# Patient Record
Sex: Female | Born: 1975 | Race: Black or African American | Hispanic: No | Marital: Single | Smoking: Former smoker
Health system: Southern US, Community
[De-identification: ages and names within clinical notes are randomized; demographics above are authoritative.]

## PROBLEM LIST (undated history)

## (undated) HISTORY — PX: OTHER SURGICAL HISTORY: SHX169

## (undated) HISTORY — PX: PARTIAL HYSTERECTOMY: SHX80

## (undated) HISTORY — PX: OVARIAN CYST REMOVAL: SHX89

## (undated) HISTORY — PX: ABDOMINAL HYSTERECTOMY: SHX81

---

## 2011-12-09 ENCOUNTER — Emergency Department: Payer: Self-pay | Admitting: Emergency Medicine

## 2011-12-09 LAB — COMPREHENSIVE METABOLIC PANEL
Anion Gap: 8 (ref 7–16)
Calcium, Total: 9.2 mg/dL (ref 8.5–10.1)
Chloride: 109 mmol/L — ABNORMAL HIGH (ref 98–107)
Co2: 24 mmol/L (ref 21–32)
EGFR (Non-African Amer.): >60
Osmolality: 281 (ref 275–301)
Potassium: 3.4 mmol/L — ABNORMAL LOW (ref 3.5–5.1)
SGOT(AST): 11 U/L — ABNORMAL LOW (ref 15–37)

## 2011-12-09 LAB — URINALYSIS, COMPLETE
Glucose,UR: NEGATIVE mg/dL (ref 0–75)
Nitrite: NEGATIVE
Protein: NEGATIVE
RBC,UR: 1 /HPF (ref 0–5)

## 2011-12-09 LAB — LIPASE, BLOOD: Lipase: 254 U/L (ref 73–393)

## 2011-12-09 LAB — CBC
MCH: 30.5 pg (ref 26.0–34.0)
MCHC: 34.3 g/dL (ref 32.0–36.0)
MCV: 89 fL (ref 80–100)
Platelet: 227 10*3/uL (ref 150–440)
RBC: 4.36 10*6/uL (ref 3.80–5.20)

## 2011-12-14 ENCOUNTER — Emergency Department: Payer: Self-pay | Admitting: Emergency Medicine

## 2011-12-14 LAB — COMPREHENSIVE METABOLIC PANEL
Albumin: 3.6 g/dL (ref 3.4–5.0)
Alkaline Phosphatase: 71 U/L (ref 50–136)
BUN: 8 mg/dL (ref 7–18)
Calcium, Total: 8.4 mg/dL — ABNORMAL LOW (ref 8.5–10.1)
Co2: 23 mmol/L (ref 21–32)
Creatinine: 0.91 mg/dL (ref 0.60–1.30)
EGFR (Non-African Amer.): >60
Glucose: 89 mg/dL (ref 65–99)
Potassium: 3.6 mmol/L (ref 3.5–5.1)
SGPT (ALT): 16 U/L (ref 12–78)
Sodium: 141 mmol/L (ref 136–145)

## 2011-12-14 LAB — URINALYSIS, COMPLETE
Bilirubin,UR: NEGATIVE
Blood: NEGATIVE
Ketone: NEGATIVE
Ph: 7 (ref 4.5–8.0)
Protein: NEGATIVE
Specific Gravity: 1.016 (ref 1.003–1.030)
Squamous Epithelial: 11

## 2011-12-14 LAB — PREGNANCY, URINE: Pregnancy Test, Urine: NEGATIVE m[IU]/mL

## 2011-12-14 LAB — CBC
Platelet: 173 10*3/uL (ref 150–440)
RBC: 4.24 10*6/uL (ref 3.80–5.20)
WBC: 5.8 10*3/uL (ref 3.6–11.0)

## 2011-12-14 LAB — LIPASE, BLOOD: Lipase: 247 U/L (ref 73–393)

## 2011-12-15 LAB — DRUG SCREEN, URINE
Amphetamines, Ur Screen: NEGATIVE (ref ?–1000)
Barbiturates, Ur Screen: NEGATIVE (ref ?–200)
Cannabinoid 50 Ng, Ur ~~LOC~~: POSITIVE (ref ?–50)
Opiate, Ur Screen: POSITIVE (ref ?–300)
Phencyclidine (PCP) Ur S: NEGATIVE (ref ?–25)

## 2011-12-24 ENCOUNTER — Encounter (HOSPITAL_COMMUNITY): Payer: Self-pay

## 2011-12-24 ENCOUNTER — Emergency Department (HOSPITAL_COMMUNITY)
Admission: EM | Admit: 2011-12-24 | Discharge: 2011-12-24 | Disposition: A | Discharge status: Home / Self Care | Payer: Self-pay | Attending: Emergency Medicine | Admitting: Emergency Medicine

## 2011-12-24 DIAGNOSIS — N898 Other specified noninflammatory disorders of vagina: Secondary | ICD-10-CM | POA: Insufficient documentation

## 2011-12-24 DIAGNOSIS — R109 Unspecified abdominal pain: Secondary | ICD-10-CM | POA: Insufficient documentation

## 2011-12-24 DIAGNOSIS — R102 Pelvic and perineal pain: Secondary | ICD-10-CM

## 2011-12-24 DIAGNOSIS — F17200 Nicotine dependence, unspecified, uncomplicated: Secondary | ICD-10-CM | POA: Insufficient documentation

## 2011-12-24 LAB — COMPREHENSIVE METABOLIC PANEL
Alkaline Phosphatase: 62 U/L (ref 39–117)
BUN: 9 mg/dL (ref 6–23)
CO2: 23 mEq/L (ref 19–32)
Chloride: 101 mEq/L (ref 96–112)
GFR calc Af Amer: >90 mL/min (ref >90)
GFR calc non Af Amer: >90 mL/min (ref >90)
Glucose, Bld: 82 mg/dL (ref 70–99)
Potassium: 3.3 mEq/L — ABNORMAL LOW (ref 3.5–5.1)
Total Bilirubin: 0.3 mg/dL (ref 0.3–1.2)

## 2011-12-24 LAB — URINALYSIS, ROUTINE W REFLEX MICROSCOPIC
Bilirubin Urine: NEGATIVE
Glucose, UA: NEGATIVE mg/dL
Leukocytes, UA: NEGATIVE
Nitrite: NEGATIVE
Specific Gravity, Urine: 1.021 (ref 1.005–1.030)
pH: 6 (ref 5.0–8.0)

## 2011-12-24 LAB — PREGNANCY, URINE: Preg Test, Ur: NEGATIVE

## 2011-12-24 LAB — CBC WITH DIFFERENTIAL/PLATELET
HCT: 35.5 % — ABNORMAL LOW (ref 36.0–46.0)
Hemoglobin: 12.3 g/dL (ref 12.0–15.0)
Lymphs Abs: 2 10*3/uL (ref 0.7–4.0)
Monocytes Absolute: 0.4 10*3/uL (ref 0.1–1.0)
Monocytes Relative: 5 % (ref 3–12)
Neutro Abs: 5.1 10*3/uL (ref 1.7–7.7)
Neutrophils Relative %: 68 % (ref 43–77)
RBC: 4.13 MIL/uL (ref 3.87–5.11)

## 2011-12-24 LAB — WET PREP, GENITAL: Trich, Wet Prep: NONE SEEN

## 2011-12-24 MED ORDER — ONDANSETRON HCL 4 MG/2ML IJ SOLN
4.0000 mg | Freq: Once | INTRAMUSCULAR | Status: AC
  Administered 2011-12-24: 4 mg via INTRAVENOUS
  Filled 2011-12-24: qty 2

## 2011-12-24 MED ORDER — HYDROCODONE-ACETAMINOPHEN 5-500 MG PO TABS: 1.0000 | ORAL_TABLET | Freq: Four times a day (QID) | ORAL | Status: DC | PRN

## 2011-12-24 MED ORDER — MORPHINE SULFATE 4 MG/ML IJ SOLN
4.0000 mg | Freq: Once | INTRAMUSCULAR | Status: AC
  Administered 2011-12-24: 4 mg via INTRAVENOUS
  Filled 2011-12-24: qty 1

## 2011-12-24 NOTE — ED Notes (Signed)
Per Pt, having vaginal bleed and pain.  No periods since 2009 d/t partial hysterectomy.  Pain started this am.  No difficulty urinating.  Pt states nausea, no vomiting or diarrhea.  Decreased appetite d/t pain.

## 2011-12-24 NOTE — ED Notes (Signed)
Pt presents to the Ed with a history of a partial hysterectomy.  The right ovary was left.  Pt states hospital in [place] states that she had an ovarian cyst but that it would pass.  Patient is doubled over with pain.

## 2011-12-24 NOTE — ED Provider Notes (Signed)
History     CSN: [id number]  Arrival date & time 12/24/11  4098   First MD Initiated Contact with Patient 12/24/11 0450      Chief Complaint  Patient presents with  . Vaginal Bleeding  . Abdominal Pain    (Consider location/radiation/quality/duration/timing/severity/associated sxs/prior treatment) HPI Comments: Patient comes in today with a chief complaint of right sided pelvic pain and vaginal bleeding.  She reports that her symptoms began yesterday morning and have been constant since that time.  Pain gradual in onset and has not changed from the time of onset.  She describes the pain as an intense pressure.  She reports that the pain feels similar to when she had a ruptured ovarian cyst in the past.  Past surgical history significant for Partial Hysterectomy in 2009.  She states that she still has her right ovary.  She reports that she is feeling nauseous, but no vomiting.  No diarrhea.  Last BM was yesterday and was normal.  She denies fever or chills.    The history is provided by the patient.    History reviewed. No pertinent past medical history.  Past Surgical History  Procedure Date  . Partial hysterectomy   . Firbroidectomy   . Ovarian cyst removal   . Abdominal hysterectomy     History reviewed. No pertinent family history.  History  Substance Use Topics  . Smoking status: Current Some Day Smoker -- 0.5 packs/day  . Smokeless tobacco: Not on file  . Alcohol Use: Yes     social    OB History    Grav Para Term Preterm Abortions TAB SAB Ect Mult Living                  Review of Systems  Constitutional: Negative for fever, chills and appetite change.  Gastrointestinal: Positive for nausea. Negative for vomiting, diarrhea, constipation and blood in stool.  Genitourinary: Positive for vaginal bleeding and pelvic pain. Negative for dysuria, frequency, hematuria and vaginal discharge.    Allergies  Review of patient's allergies indicates not on file.  Home  Medications   Current Outpatient Rx  Name Route Sig Dispense Refill  . IBUPROFEN 200 MG PO TABS Oral Take 400 mg by mouth every 8 (eight) hours as needed. For pain    . METRONIDAZOLE 500 MG PO TABS Oral Take 500 mg by mouth 3 (three) times daily. For 7 days. Started 12/05/11 and finished.      BP 135/75  Pulse 69  Temp 98 F (36.7 C) (Oral)  Resp 21  Ht 5\' 6"  (1.676 m)  Wt 155 lb (70.308 kg)  BMI 25.02 kg/m2  SpO2 100%  Physical Exam  Nursing note and vitals reviewed. Constitutional: She appears well-developed and well-nourished. No distress.  HENT:  Head: Normocephalic and atraumatic.  Mouth/Throat: Oropharynx is clear and moist.  Neck: Normal range of motion. Neck supple.  Cardiovascular: Normal rate, regular rhythm and normal heart sounds.   Pulmonary/Chest: Effort normal and breath sounds normal.  Abdominal: Soft. Bowel sounds are normal. She exhibits no distension and no mass. There is tenderness in the right lower quadrant. There is no rigidity, no rebound, no guarding and no tenderness at McBurney's point.       Negative Rovsing's.    Genitourinary: Right adnexum displays mass and tenderness. Right adnexum displays no fullness. Left adnexum displays no mass, no tenderness and no fullness.       No vaginal bleeding  Neurological: She is alert.  Skin:  Skin is warm and dry. She is not diaphoretic.  Psychiatric: She has a normal mood and affect.    ED Course  Procedures (including critical care time)  Labs Reviewed  URINALYSIS, ROUTINE W REFLEX MICROSCOPIC - Abnormal; Notable for the following:    APPearance CLOUDY (*)     Ketones, ur 15 (*)     All other components within normal limits  CBC WITH DIFFERENTIAL  COMPREHENSIVE METABOLIC PANEL   No results found.   No diagnosis found.    MDM  Patient presents today with right sided pelvic pain.  She reports that it feels similar to pain she had in the past with Ovarian cyst.  She also states that she has had some  vaginal bleeding.  However, patient had a Partial Hysterectomy.  No blood visualized on pelvic exam.  Patient did have right sided adnexal tenderness.  However, patient is afebrile.  WBC within normal limits.  Therefore, doubt tuboovarian abscess.  Patient has had the pain for 24 hours.  Pain gradual in onset.  Patient not writhing in pain.  Therefore, doubt Ovarian Torsion.  Therefore, do not feel that Pelvic Ultrasound needs to be done on an emergent basis.  Patient discharged home and instructed to follow up with her OB/GYN.         Pascal Lux [place], PA-C 12/24/11 [phone]

## 2011-12-25 NOTE — ED Provider Notes (Signed)
Medical screening examination/treatment/procedure(s) were performed by non-physician practitioner and as supervising physician I was immediately available for consultation/collaboration.   Mcguire Gasparyan R Thorn Demas, MD 12/25/11 0445 

## 2012-01-21 ENCOUNTER — Emergency Department (HOSPITAL_COMMUNITY)
Admission: EM | Admit: 2012-01-21 | Discharge: 2012-01-21 | Disposition: A | Discharge status: Home / Self Care | Payer: Self-pay | Attending: Emergency Medicine | Admitting: Emergency Medicine

## 2012-01-21 ENCOUNTER — Encounter (HOSPITAL_COMMUNITY): Payer: Self-pay | Admitting: Emergency Medicine

## 2012-01-21 DIAGNOSIS — K0889 Other specified disorders of teeth and supporting structures: Secondary | ICD-10-CM

## 2012-01-21 DIAGNOSIS — Z87891 Personal history of nicotine dependence: Secondary | ICD-10-CM | POA: Insufficient documentation

## 2012-01-21 DIAGNOSIS — K089 Disorder of teeth and supporting structures, unspecified: Secondary | ICD-10-CM | POA: Insufficient documentation

## 2012-01-21 MED ORDER — HYDROCODONE-ACETAMINOPHEN 5-325 MG PO TABS: 1.0000 | ORAL_TABLET | Freq: Three times a day (TID) | ORAL | Status: AC | PRN

## 2012-01-21 MED ORDER — HYDROCODONE-ACETAMINOPHEN 5-325 MG PO TABS
1.0000 | ORAL_TABLET | Freq: Once | ORAL | Status: AC
  Administered 2012-01-21: 1 via ORAL
  Filled 2012-01-21: qty 1

## 2012-01-21 NOTE — ED Notes (Addendum)
C/O left upper tooth pain and headache. States has broken tooth. Poor dentition.

## 2012-01-21 NOTE — ED Notes (Signed)
Pt here for toothache started yesterday, pain awake her up this morning. Pt reports left sided headache. Pt rate pain 10/10.

## 2012-01-21 NOTE — ED Provider Notes (Signed)
History  Scribed for Gerhard Munch, MD, the patient was seen in room TR08C/TR08C. This chart was scribed by Candelaria Stagers. The patient's care started at 2:52 PM   CSN: [id number]  Arrival date & time 01/21/12  1412   First MD Initiated Contact with Patient 01/21/12 1442      Chief Complaint  Patient presents with  . Dental Pain   The history is provided by the patient. No language interpreter was used.   Crystal Gentry is a 36 y.o. female who presents to the Emergency Department complaining of left sided dental pain and facial pain that started yesterday.  Pt denies fever, nausea, or vomiting.  Pt has had dental problems to the right side.  She has taken ibuprofen with no relief.  She currently does not have dental insurance.     No past medical history on file.  Past Surgical History  Procedure Date  . Partial hysterectomy   . Firbroidectomy   . Ovarian cyst removal   . Abdominal hysterectomy     No family history on file.  History  Substance Use Topics  . Smoking status: Former Smoker -- 0.5 packs/day  . Smokeless tobacco: Not on file  . Alcohol Use: Yes     Comment: social    OB History    Grav Para Term Preterm Abortions TAB SAB Ect Mult Living                  Review of Systems  Constitutional: Negative for fever and chills.       Per HPI, otherwise negative  HENT: Positive for dental problem (left side).        Per HPI, otherwise negative  Eyes: Negative.   Respiratory:       Per HPI, otherwise negative  Cardiovascular:       Per HPI, otherwise negative  Gastrointestinal: Negative for nausea and vomiting.  Genitourinary: Negative.   Musculoskeletal:       Per HPI, otherwise negative  Skin: Negative.   Neurological: Negative for syncope.  All other systems reviewed and are negative.    Allergies  Vicodin  Home Medications   Current Outpatient Rx  Name  Route  Sig  Dispense  Refill  . IBUPROFEN 200 MG PO TABS   Oral   Take 400 mg by  mouth every 8 (eight) hours as needed. For pain           BP 117/86  Pulse 88  Temp 98.8 F (37.1 C) (Oral)  Resp 16  SpO2 97%  Physical Exam  Nursing note and vitals reviewed. Constitutional: She is oriented to person, place, and time. She appears well-developed and well-nourished. No distress.  HENT:  Head: Normocephalic and atraumatic.       Mandible non tender.  No TMJ tenderness.   Last molar on the left superior side is fractured.    Eyes: EOM are normal.  Neck: Neck supple. No tracheal deviation present.  Cardiovascular: Normal rate.   Pulmonary/Chest: Effort normal. No respiratory distress.  Musculoskeletal: Normal range of motion.  Neurological: She is alert and oriented to person, place, and time.  Skin: Skin is warm and dry.  Psychiatric: She has a normal mood and affect. Her behavior is normal.    ED Course  Procedures   DIAGNOSTIC STUDIES: Oxygen Saturation is 97% on room air, normal by my interpretation.    COORDINATION OF CARE:     Labs Reviewed - No data to display No results found.  No diagnosis found.    MDM  I personally performed the services described in this documentation, which was scribed in my presence. The recorded information has been reviewed and is accurate.  This generally well female presents with ongoing facial pain.  On exam she is in no distress, though she is uncomfortable appearing.  The patient has a notable broken tooth, with no surrounding fluctuance, drainage, and little suspicion of ongoing infection.  However, given the significance of the pain, she was provided analgesics.  We discussed the need for prompt dental followup for definitive care, and return precautions.  Gerhard Munch, MD 01/21/12 [phone]

## 2012-01-28 ENCOUNTER — Emergency Department: Payer: Self-pay | Admitting: Emergency Medicine

## 2013-08-08 IMAGING — CT CT ABD-PELV W/ CM
1 of 2 series · 14 of 32 positions shown, 18 images · non-contrast
Comparison: none

REASON FOR EXAM: (1) rlq abd pain, eval for appy; (2) same
COMMENTS:

[Series 2: 3mm soft tissue · axial · 0.66mm/px · z∈[-394,+8]mm · 14 of 154 slices shown, 18 images]
[im 13/154  soft-tissue]
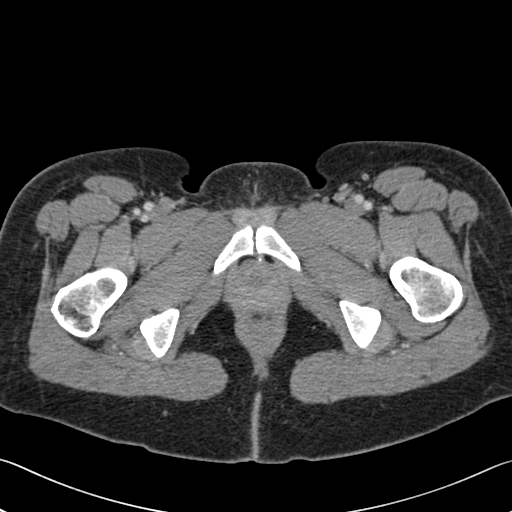
[im 13/154  bone]
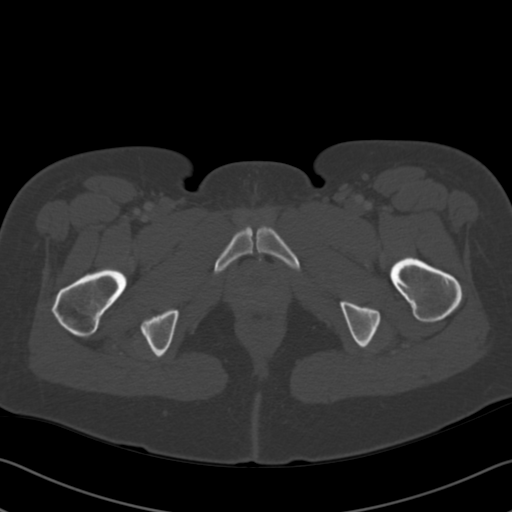
[im 25/154  soft-tissue]
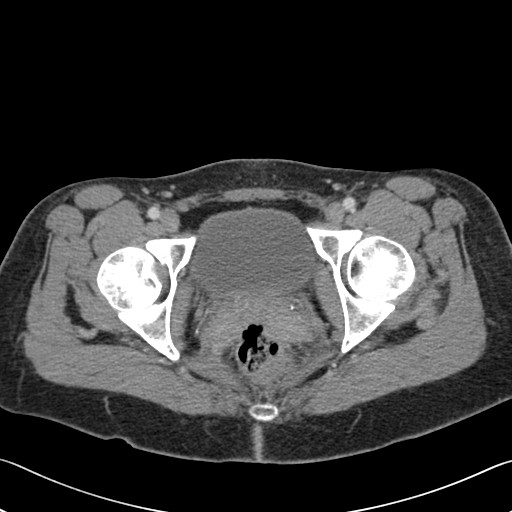
[im 37/154  soft-tissue]
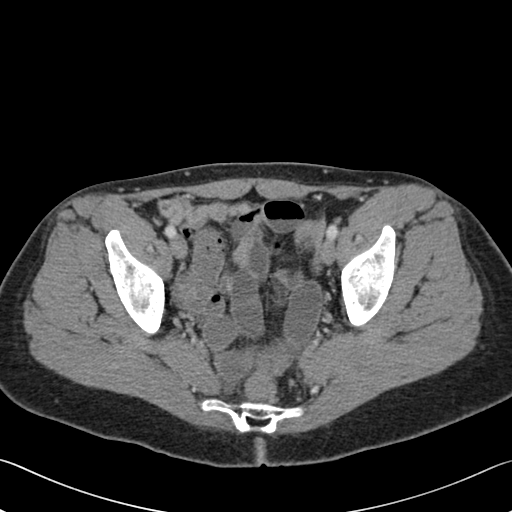
[im 49/154  soft-tissue]
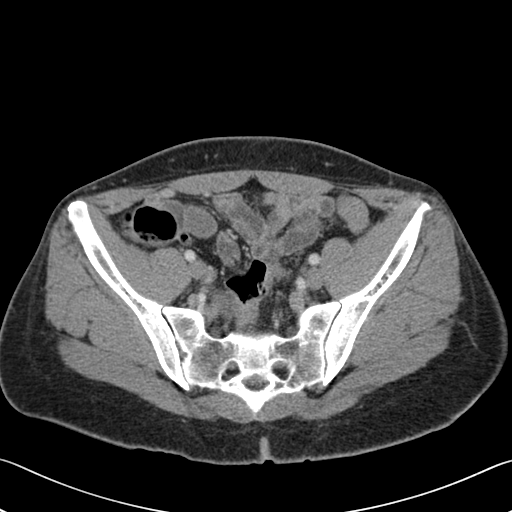
[im 62/154  soft-tissue]
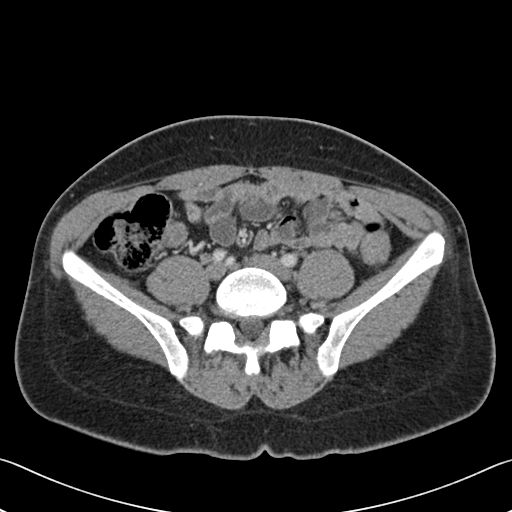
[im 74/154  soft-tissue]
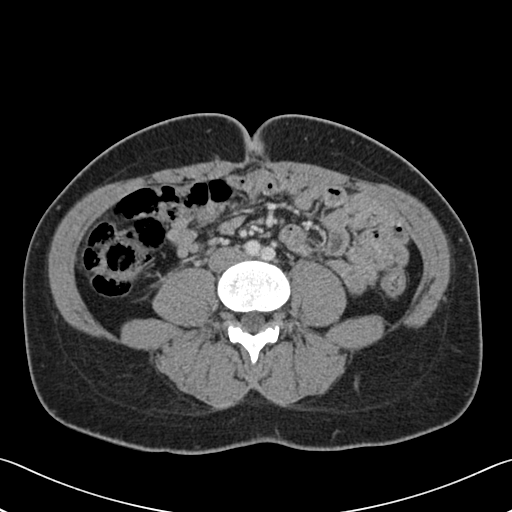
[im 86/154  soft-tissue]
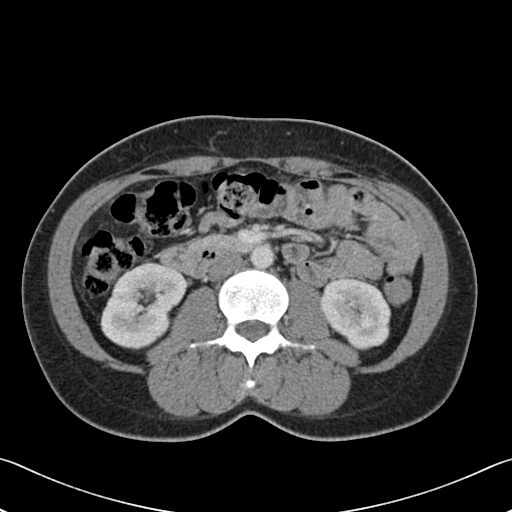
[im 98/154  soft-tissue]
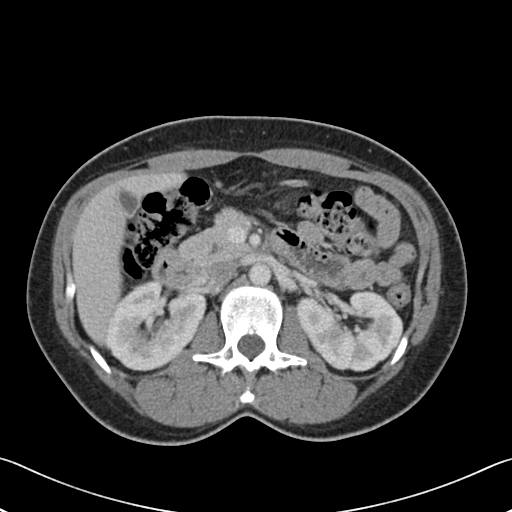
[im 111/154  soft-tissue]
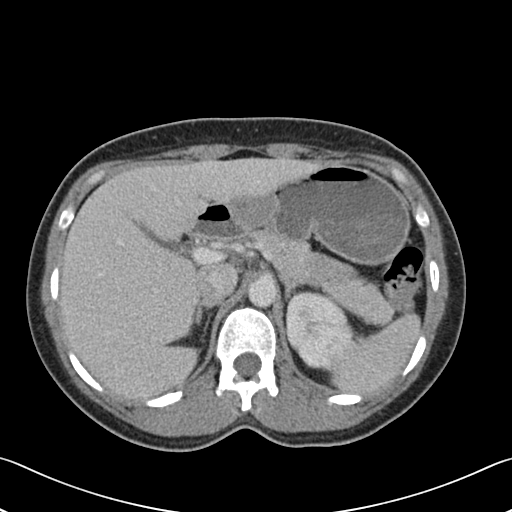
[im 111/154  bone]
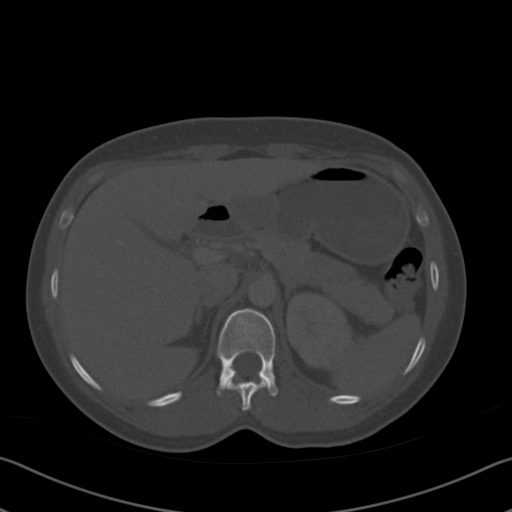
[im 123/154  soft-tissue]
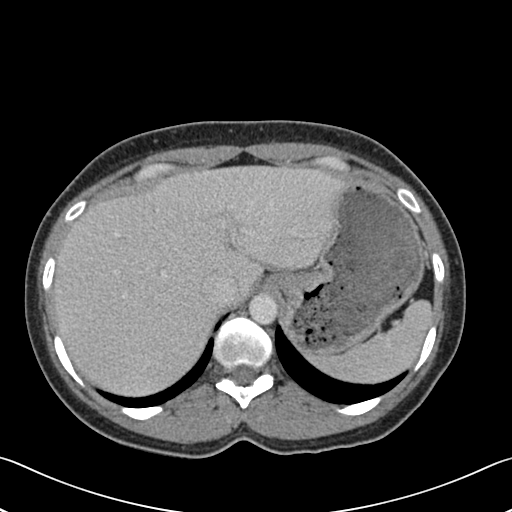
[im 129/154  lung]
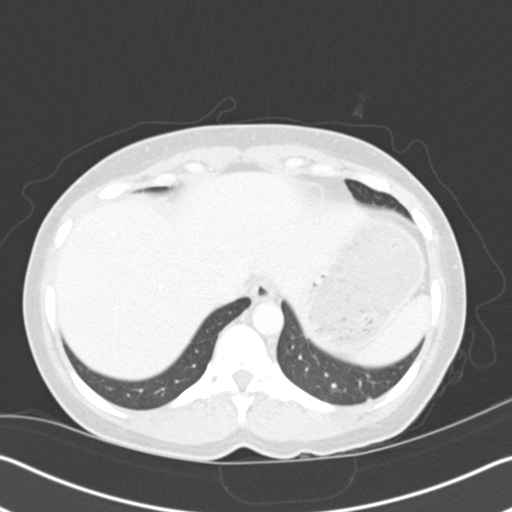
[im 135/154  soft-tissue]
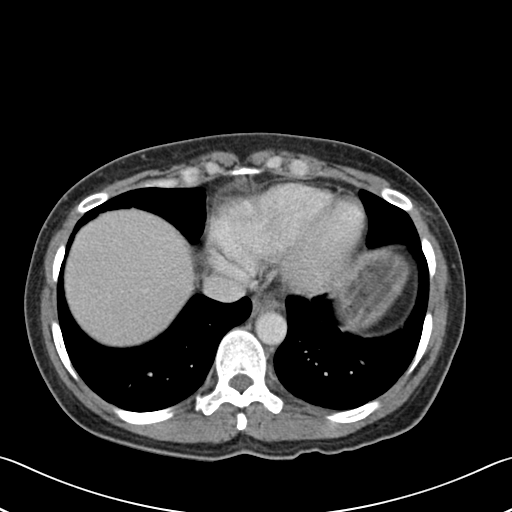
[im 135/154  lung]
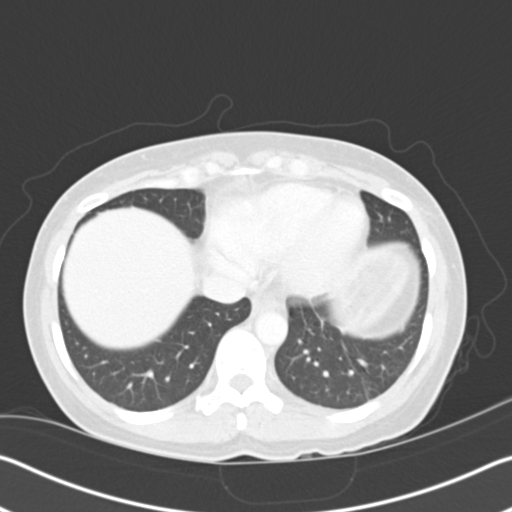
[im 141/154  lung]
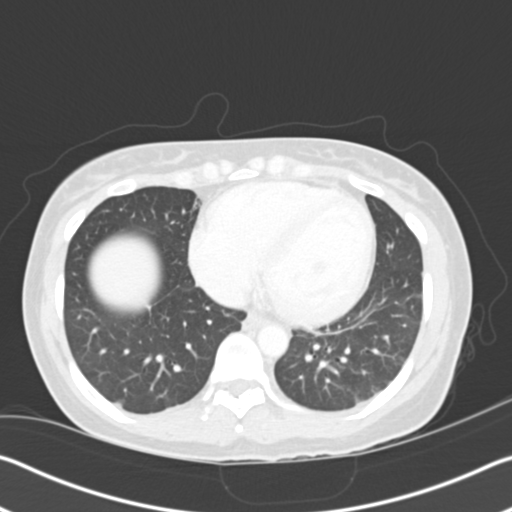
[im 147/154  soft-tissue]
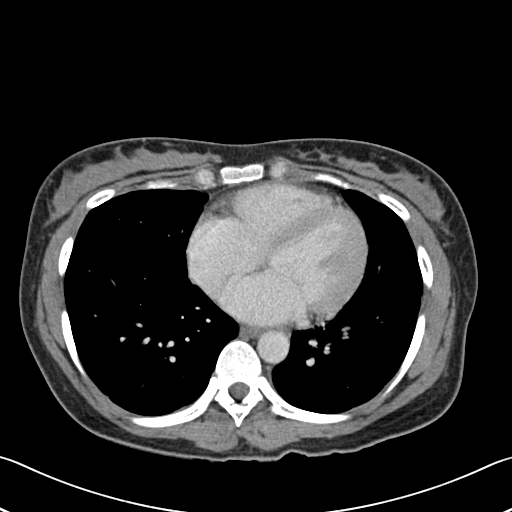
[im 147/154  lung]
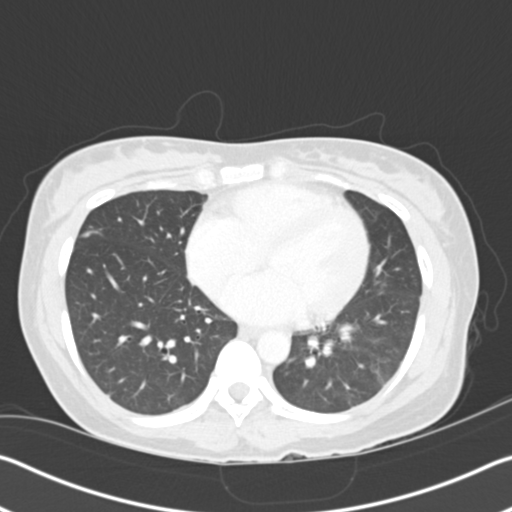

[14 of 32 positions shown; findings below may reference images not displayed]

PROCEDURE:     CT  - CT ABDOMEN / PELVIS  W  - December 09, 2011  [DATE]

RESULT:     Axial CT scanning was performed through the abdomen and pelvis
following intravenous and ministration of 85 cc of Isovue 300. The patient
did not receive oral contrast material.

The liver exhibits no focal mass. There is no significant intrahepatic
ductal dilation. There is no evidence of a subcapsular hemorrhage or
parenchymal laceration. The gallbladder is partially distended and grossly
normal. No calcified stones are demonstrated. The spleen exhibits no
evidence of a parenchymal laceration or subcapsular hemorrhage. No
perisplenic fluid is demonstrated. The stomach is moderately distended with
fluid and fluid. The pancreas exhibits no focal mass or acute inflammatory
change. The adrenal glands and kidneys exhibit no acute abnormalities. The
caliber of the abdominal aorta is normal.

There is moderate distention of numerous small bowel loops predominantly
within the pelvis with fluid. The colon contains a moderate amount of stool
and gas in its ascending and transverse portions. The descending colon and
rectosigmoid are relatively collapsed with only a small amount of stool and
gas demonstrated. There is a structure consistent with a normal calibered
gas-filled appendix on images 102 to 122. There is a trace of free fluid in
the pelvis. The uterus is apparently surgically absent. There is a cystic
right adnexal process with an enhancing rim measuring 2 cm in diameter. It
is demonstrated on images 119 through 125. The partially distended urinary
bladder is normal in appearance. The lung bases exhibit no acute
abnormalities. The lumbar vertebral bodies are preserved in height.
IMPRESSION: 1. There is no evidence of an acute appendicitis.
2. There is a moderate amount of fluid within small bowel loops especially
those loops within the pelvis. The pattern does not suggest obstruction
however. The colon exhibits no acute inflammatory changes. There is a
moderate amount of fluid and fluid within the stomach.
2. There in no evidence of gallstones or definite evidence of other acute
hepatobiliary abnormalities.
3. There is no evidence of a parenchymal laceration or subcapsular
hemorrhage or significant intra-abdominal or pelvic free fluid.
4. There is a cystic right adnexal process. Further evaluation with pelvic
ultrasound may be useful.

[REDACTED]

## 2013-08-13 IMAGING — US US PELV - US TRANSVAGINAL
1 series · 14 of 25 positions shown · non-contrast
Comparison: none

REASON FOR EXAM: pelvic pain
COMMENTS:

[Series 1: us pelv - us transvaginal · 0.25mm/px · 14 of 44 slices shown]
[im 1/44]
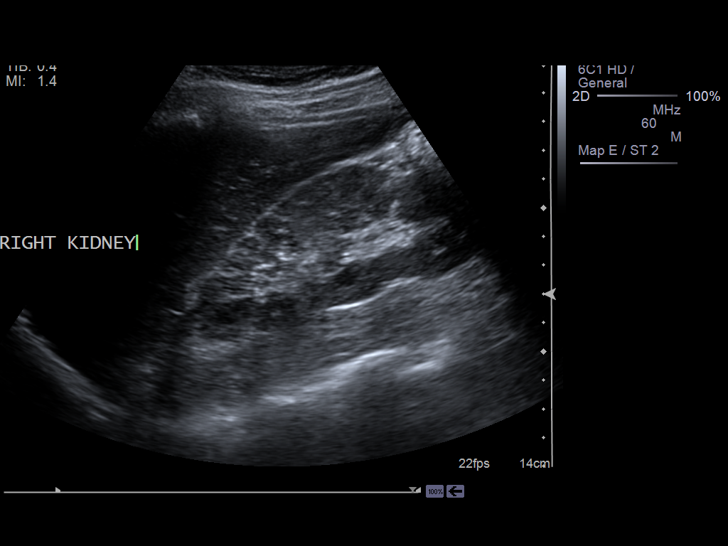
[im 4/44]
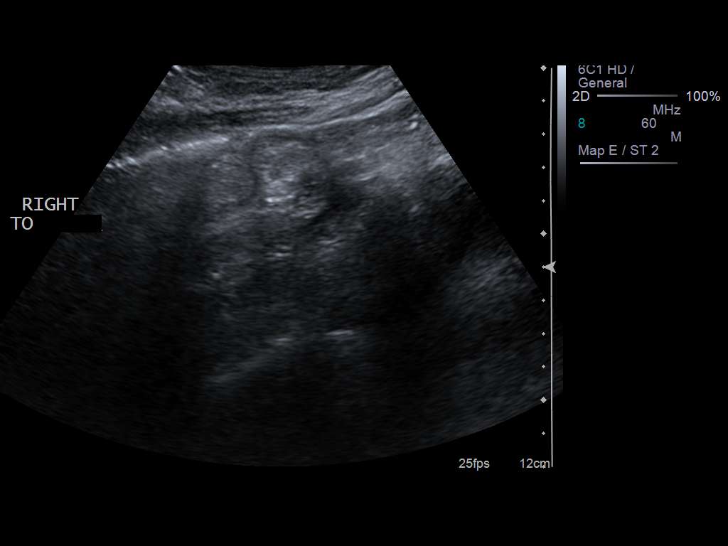
[im 8/44]
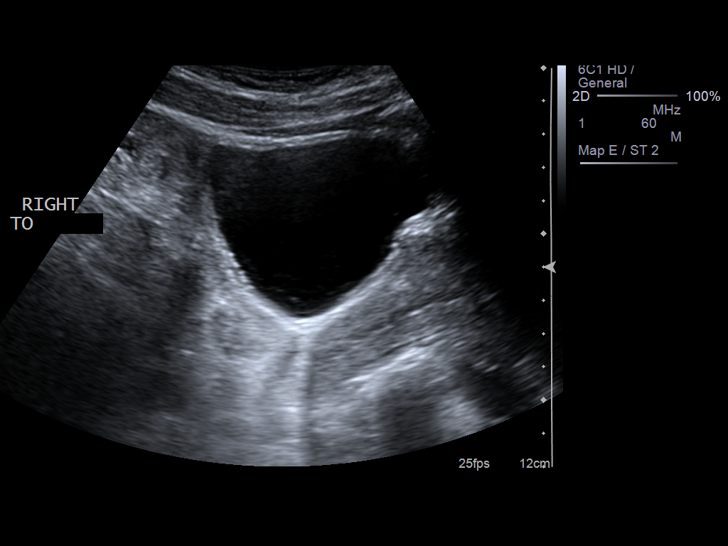
[im 11/44]
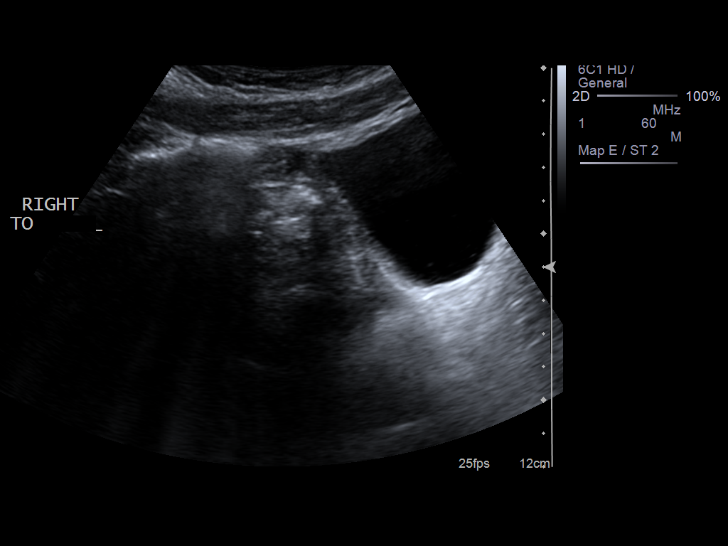
[im 15/44]
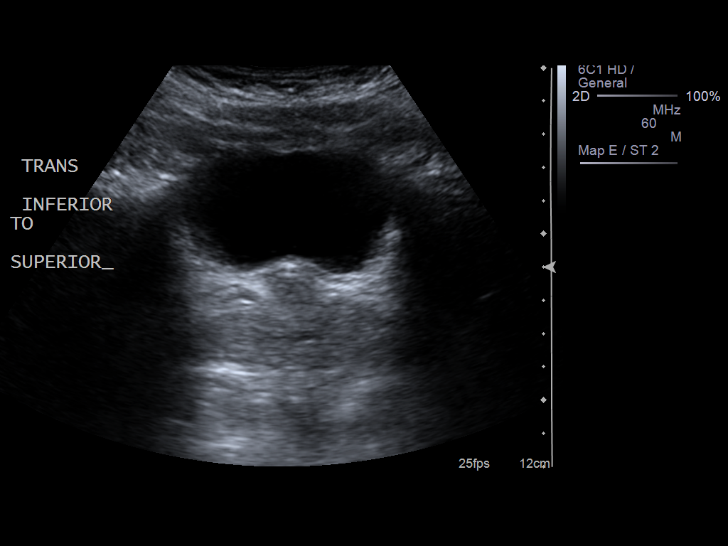
[im 17/44]
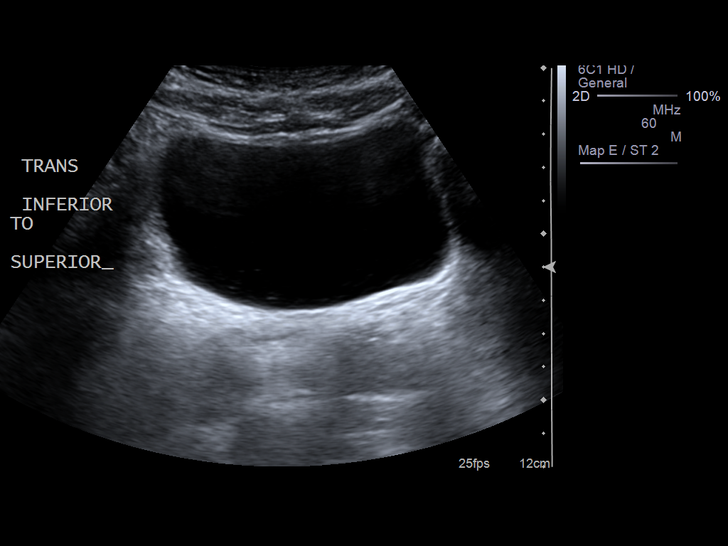
[im 20/44]
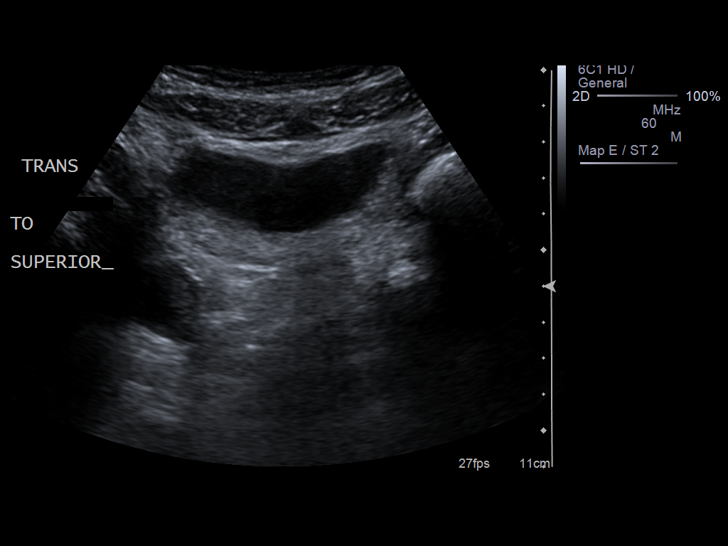
[im 24/44]
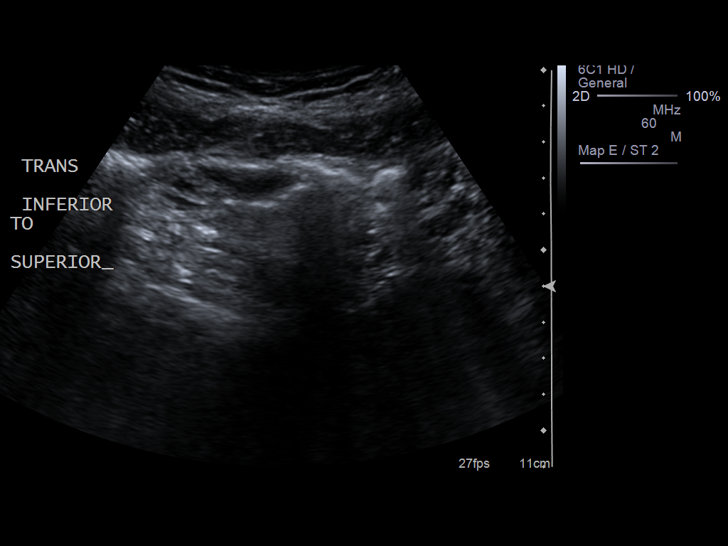
[im 27/44]
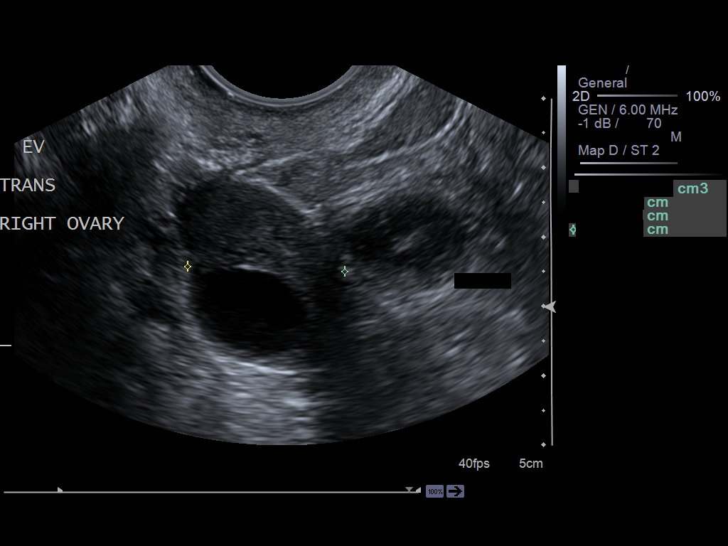
[im 29/44]
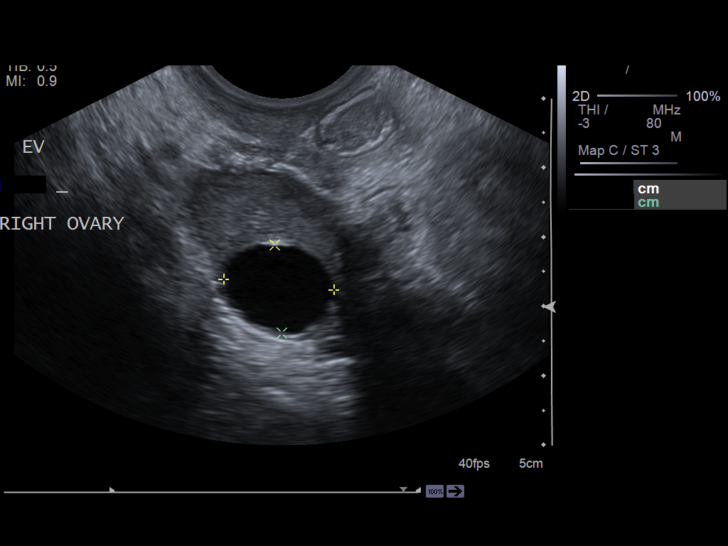
[im 33/44]
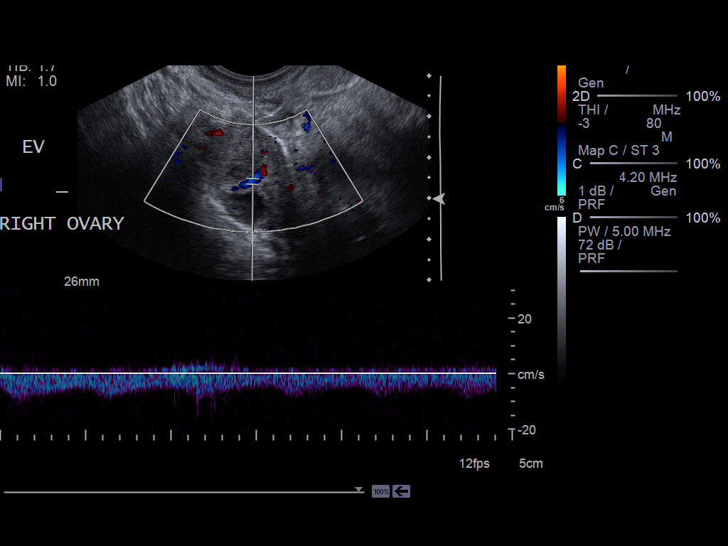
[im 36/44]
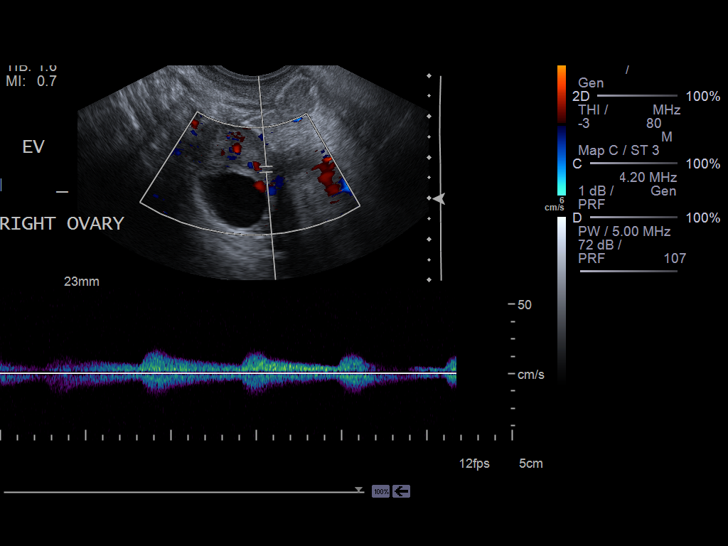
[im 40/44]
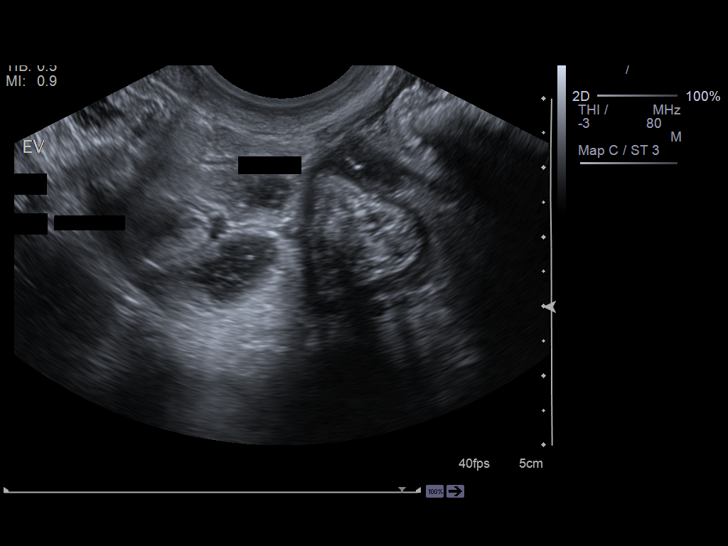
[im 44/44]
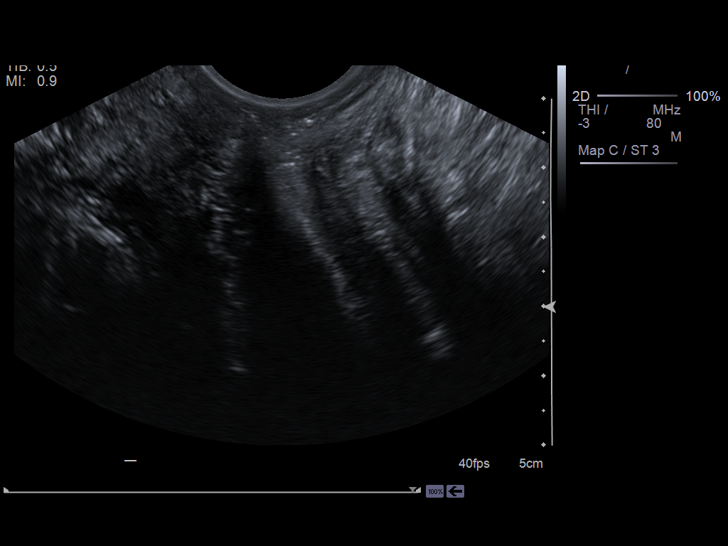

[14 of 25 positions shown; findings below may reference images not displayed]

PROCEDURE:     US  - US PELVIS EXAM W/TRANSVAGINAL  - December 14, 2011 [DATE]

RESULT:

Emergent Pelvic Sonogram is performed with transabdominal and endovaginal
images. There is a history of hysterectomy. The kidneys appear to be grossly
normal on survey images. The right ovary measures 3.54 x 1.98 x 2.27 cm and
contains a 1.45 x 1.60 x 1.27 cm cyst. Blood flow is documented in the right
ovary with arterial and venous waveforms demonstrated. No free fluid is
demonstrated. The left ovary has been removed.
IMPRESSION: No evidence of right ovarian torsion. There is a right ovarian
cyst present as described. This appears to be a smoothly marginated,
moderate sized simple cy[REDACTED]

## 2014-06-26 NOTE — Consult Note (Signed)
Brief Consult Note: Diagnosis: Mood disorder NOS.   Patient was seen by consultant.   Consult note dictated.   Recommend further assessment or treatment.   Orders entered.   Discussed with Attending MD.   Comments: Ms. Crystal Gentry has a h/o bipolar disorder. She had been maintained on Seroquel 300 mg with much improvement. She run out of medications a month ago. [place] recently and has not been able to establish psychiatric care in the area. She has no health insurance and will require pharma assisstance. She is not interested in trying another medication.   PLAN: 1. The patient no longer meets criteria for IVC. I will terminate proceedings. Please discharge as appropriate.  2. I will prescribe Seroquel 300 mg. The patient will be able to obtain [facility] from [facility].    3. She will follow up with [facility] in [place] walk in clinic Mon-Fri 8:00- 5:00.  4. The patient has a car at the parking lot. She may drive home.    4. Her.  Electronic Signatures: Kristine LineaPucilowska, Citlaly Camplin (MD)  (Signed 08-Oct-13 11:38)  Authored: Brief Consult Note   Last Updated: 08-Oct-13 11:38 by Kristine LineaPucilowska, Kaleb Sek (MD)

## 2014-06-26 NOTE — Consult Note (Signed)
PATIENT NAME:  Crystal Gentry, Crystal Gentry:  01/12/1976  DATE OF CONSULTATION:  12/15/2011  REFERRING PHYSICIAN:  Dr. Jene Everyobert Kinner  CONSULTING PHYSICIAN:  Bellami Farrelly B. Greenley Martone, MD  REASON FOR CONSULTATION: To evaluate a suicidal patient.   IDENTIFYING DATA: Crystal Gentry is a 39 year old female with history of self-reported bipolar disorder.   CHIEF COMPLAINT: "I want to go home now."   HISTORY OF PRESENT ILLNESS: Crystal Gentry relocated recently from [place] to [place] to live with her family. She was seen in the Emergency Room on October 2 when she complains of abdominal pain stemming from ovarian cyst. She was prescribed Percocet in the Emergency Room. At home she noted she is unable to tolerate these pills as she has been vomiting. She returned to the Emergency Room last night complaining of headache and vomiting, most likely induced by narcotic pain killers. In talking to nursing staff in the Emergency Room she must have been said something about feeling unsafe. The patient adamantly denies and feels that she needs to be restarted on her bipolar medication, put in touch with an outpatient psychiatrist and sent home. She reports that she has been on Seroquel 300 mg at night for a very long time. She ran out a month ago. She has not been able to establish psychiatric care in our area yet. She reports mood swings, depressed mood, feelings of guilt, hopelessness, worthlessness, increased anxiety, poor energy, irritability, some restlessness and racing thoughts. She realizes that Seroquel is pretty pricey and that she may not be able to afford it. She was able to take in [place] through [employer] assistance program and she hopes that she will be able to find similar arrangements in our area. She denies symptoms of psychosis. She denies alcohol, illicit drugs or prescription pill use.   PAST PSYCHIATRIC HISTORY: She was hospitalized for suicide attempt by medication overdose in  2011 in [place]. She denies other suicide attempts or substance abuse. She is eager to establish care with mental health professionals in our area.   FAMILY PSYCHIATRIC HISTORY: None reported.   PAST MEDICAL HISTORY: None.   MEDICATIONS ON ADMISSION: None.   ALLERGIES: No known drug allergies.   SOCIAL HISTORY: She is married. Her husband lives in [place]. They are separated possibly to be divorced. 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 She came to [place] to live with her aunt. The plan is to go back to school.    REVIEW OF SYSTEMS: CONSTITUTIONAL: No fevers or chills. No weight changes. EYES: No double or blurred vision. ENT: No hearing loss. RESPIRATORY: No shortness of breath or cough. CARDIOVASCULAR: No chest pain or orthopnea. GASTROINTESTINAL: No abdominal pain, nausea, vomiting, or diarrhea. GENITOURINARY: No incontinence or frequency. ENDOCRINE: No heat or cold intolerance. LYMPHATIC: No anemia or easy bruising. INTEGUMENTARY: No acne or rash. MUSCULOSKELETAL: No muscle or joint pain. NEUROLOGIC: No tingling or weakness. PSYCHIATRIC: See history of present illness for details.   PHYSICAL EXAMINATION:  VITAL SIGNS: Blood pressure 112/71, pulse 58, respirations 18, temperature 99.2.   GENERAL: This is a well-developed female in no acute distress. The rest of the physical examination is deferred to her primary attending.   LABORATORY, DIAGNOSTIC AND RADIOLOGICAL DATA: Chemistries are within normal limits. Blood alcohol level not tested. LFTs within normal limits. Urine tox screen positive for cannabinoids and opiates. CBC within normal limits. Urinalysis is suggestive of urinary tract infection  with 1+ leukocyte esterase and 11 white cells per field. Urine pregnancy test is negative.   MENTAL STATUS EXAM: The patient is alert and oriented to person, place, time, and situation. She is pleasant, polite, and cooperative. She is well groomed  and casually dressed with  a yellow shirt. She maintains good eye contact. Her speech is of normal rhythm, rate, and volume. Mood is fine with full affect. Thought processing is logical and goal oriented. Thought content: She denies suicidal or homicidal ideation and is surprised that has been kept in the Emergency Room after making some vague statement at the time when she was in pain. There are no delusions or paranoia. There are no auditory or visual hallucinations. Her cognition is grossly intact. She registers three out of three and recalls three out of three objects after five minutes. She can spell world forwards and backwards. She knows current president. Her insight and judgment are fair.   SUICIDE RISK ASSESSMENT: This is a patient with history of depression, mood instability and a suicide attempt in the past who came to the Emergency Room with physical problems and found herself in a psychiatric bay. She denies thoughts of hurting herself or others is able to contract for safety and will follow up with a psychiatrist in the community.   DIAGNOSES:  AXIS I:  1. Bipolar mood disorder, not otherwise specified. 2. Cannabinoid abuse.  3. Opiate abuse.   AXIS II: Deferred.   AXIS III: None.   AXIS IV: Mental illness, substance abuse, employment, financial, primary support, access to care.   AXIS V: GAF 45.   PLAN:  1. The patient no longer meets criteria for involuntary inpatient psychiatric commitment. I will discontinue proceedings. Please discharge as appropriate.  2. I will prescribe Seroquel 300 mg. The patient lives in [place]. She belongs to [facility] in [place]. They do have a pharmacy where she is able to obtain Seroquel for $5 co-pay.  3. She will follow up with [facility] in [place]. They have a walk-in clinic Monday through Friday 8-5.  4. The patient will drive herself home. His car is in the parking lot.   ____________________________ Ellin Goodie.  Jamirra Curnow, MD jbp:cms D: 12/15/2011 19:32:01 ET T: 12/16/2011 07:54:40 ET JOB#: [id number]  cc: Laguana Desautel B. Jennet Maduro, MD, <Dictator> Shari Prows MD ELECTRONICALLY SIGNED 12/16/2011 21:38
